# Patient Record
Sex: Female | Born: 1975 | Race: Black or African American | Hispanic: No | Marital: Single | Smoking: Current every day smoker
Health system: Southern US, Community
[De-identification: ages and names within clinical notes are randomized; demographics above are authoritative.]

## PROBLEM LIST (undated history)

## (undated) HISTORY — PX: TUBAL LIGATION: SHX77

---

## 2007-01-24 ENCOUNTER — Emergency Department: Payer: Self-pay

## 2011-05-13 ENCOUNTER — Emergency Department: Payer: Self-pay | Admitting: *Deleted

## 2018-10-11 ENCOUNTER — Ambulatory Visit: Payer: Self-pay | Attending: Oncology

## 2018-10-11 ENCOUNTER — Encounter (INDEPENDENT_AMBULATORY_CARE_PROVIDER_SITE_OTHER): Payer: Self-pay

## 2018-10-11 ENCOUNTER — Ambulatory Visit
Admission: RE | Admit: 2018-10-11 | Discharge: 2018-10-11 | Disposition: A | Discharge status: Home / Self Care | Payer: Self-pay | Source: Ambulatory Visit | Attending: Oncology | Admitting: Oncology

## 2018-10-11 VITALS — BP 159/92 | HR 76 | Temp 98.3°F | Ht 68.25 in | Wt 207.3 lb

## 2018-10-11 DIAGNOSIS — Z0000 Encounter for general adult medical examination without abnormal findings: Secondary | ICD-10-CM | POA: Insufficient documentation

## 2018-10-11 NOTE — Progress Notes (Addendum)
  Subjective:     Patient ID: Rebecca Larsen, female   DOB: [birth year], 43 y.o.   MRN: [id number]  HPI   Review of Systems     Objective:   Physical Exam Chest:     Breasts:        Right: No swelling, bleeding, inverted nipple, mass, nipple discharge, skin change or tenderness.        Left: No swelling, bleeding, inverted nipple, mass, nipple discharge, skin change or tenderness.       Comments: Symmetrical upper outer quadrant fibroglanular tissue and thickening Genitourinary:    Labia:        Right: No rash, tenderness, lesion or injury.        Left: No rash, tenderness, lesion or injury.      Urethra: No prolapse, urethral pain, urethral swelling or urethral lesion.     Cervix: No cervical motion tenderness, discharge, friability, lesion, erythema, cervical bleeding or eversion.     Uterus: Not deviated, not enlarged, not fixed, not tender and no uterine prolapse.      Adnexa:        Right: No mass, tenderness or fullness.         Left: No mass, tenderness or fullness.          Assessment:     43 year old patient presents for BCCCP clinic visit.  Reported to [facility] her mother had history of breast cancer, but when asked again she said no breast cancer just Lupus.  Patient screened, and meets BCCCP eligibility.  Patient does not have insurance, Medicare or Medicaid.  Handout given on Affordable Care Act.  Instructed patient on breast self awareness using teach back method.  Clinical breast exam unremarkable.  Palpated bilateral, symmetrical upper outer quadrant, fibroglandular tissue, and thickening.  No dominant mass palpated.  Pelvic exam normal .     Risk Assessment    Risk Scores      10/11/2018   Last edited by: Scarlett Presto, RN   5-year risk: 0.7 %   Lifetime risk: 9.5 %          Plan:     Sent for bilateral baseline screening mammogram.  Specimen collected for pap.

## 2018-10-12 NOTE — Progress Notes (Signed)
Letter mailed from [facility] to notify of normal mammogram results.  Pap results pending.

## 2018-10-15 LAB — PAP LB AND HPV HIGH-RISK: HPV, HIGH-RISK: NEGATIVE

## 2018-10-16 NOTE — Progress Notes (Signed)
Pap results negative/negative HPV.  Next pap in 5 years.  Patient to return in one year for BCCCP screening. Copy to [facility].

## 2019-05-30 ENCOUNTER — Other Ambulatory Visit: Payer: Self-pay

## 2019-05-30 ENCOUNTER — Emergency Department
Admission: EM | Admit: 2019-05-30 | Discharge: 2019-05-30 | Disposition: A | Discharge status: Home / Self Care | Payer: Self-pay | Attending: Emergency Medicine | Admitting: Emergency Medicine

## 2019-05-30 DIAGNOSIS — F17200 Nicotine dependence, unspecified, uncomplicated: Secondary | ICD-10-CM | POA: Insufficient documentation

## 2019-05-30 DIAGNOSIS — N611 Abscess of the breast and nipple: Secondary | ICD-10-CM | POA: Insufficient documentation

## 2019-05-30 MED ORDER — SULFAMETHOXAZOLE-TRIMETHOPRIM 800-160 MG PO TABS
1.0000 | ORAL_TABLET | Freq: Once | ORAL | Status: AC
  Administered 2019-05-30: 1 via ORAL
  Filled 2019-05-30: qty 1

## 2019-05-30 MED ORDER — LIDOCAINE HCL (PF) 1 % IJ SOLN
5.0000 mL | Freq: Once | INTRAMUSCULAR | Status: AC
  Administered 2019-05-30: 5 mL via INTRADERMAL
  Filled 2019-05-30: qty 5

## 2019-05-30 MED ORDER — SULFAMETHOXAZOLE-TRIMETHOPRIM 800-160 MG PO TABS: 1.0000 | ORAL_TABLET | Freq: Two times a day (BID) | ORAL | 0 refills | Status: DC

## 2019-05-30 NOTE — ED Notes (Signed)
Raised mass observed on medial portion of pt's rt breast, weeping with clear drainage.

## 2019-05-30 NOTE — ED Triage Notes (Signed)
Reports possible abscess to right breast X 1 month. Pt alert and oriented X4, cooperative, RR even and unlabored, color WNL. Pt in NAD. Denies drainage.

## 2019-05-30 NOTE — ED Provider Notes (Signed)
[facility] Emergency Department Provider Note  ____________________________________________  Time seen: Approximately 8:39 AM  I have reviewed the triage vital signs and the nursing notes.   HISTORY  Chief Complaint Abscess    HPI Rebecca Larsen is a 43 y.o. female that presents emergency department for evaluation of right breast abscess for about 1 month that has been more painful for the last couple of days.  Patient states that there has been some minimal drainage from the center.  She has never had an abscess prior.  No fevers.   History reviewed. No pertinent past medical history.  There are no active problems to display for this patient.   History reviewed. No pertinent surgical history.  Prior to Admission medications   Medication Sig Start Date End Date Taking? Authorizing Provider  sulfamethoxazole-trimethoprim (BACTRIM DS) 800-160 MG tablet Take 1 tablet by mouth 2 (two) times daily. 05/30/19   Laban Emperor, PA-C    Allergies Patient has no known allergies.  Family History  Problem Relation Age of Onset  . Breast cancer Neg Hx     Social History Social History   Tobacco Use  . Smoking status: Current Every Day Smoker  . Smokeless tobacco: Never Used  Substance Use Topics  . Alcohol use: Yes    Comment: occasionally  . Drug use: Never     Review of Systems  Constitutional: No fever/chills Gastrointestinal: No nausea, no vomiting.  Musculoskeletal: Negative for musculoskeletal pain. Skin: Negative for abrasions, lacerations, ecchymosis.  Positive for rash.  ____________________________________________   PHYSICAL EXAM:  VITAL SIGNS: ED Triage Vitals  Enc Vitals Group     BP 05/30/19 0811 (!) 158/75     Pulse --      Resp 05/30/19 0811 18     Temp 05/30/19 0811 98.8 F (37.1 C)     Temp Source 05/30/19 0811 Oral     SpO2 05/30/19 0811 100 %     Weight 05/30/19 0812 200 lb (90.7 kg)     Height 05/30/19 0812 5\' 8"   (1.727 m)     Head Circumference --      Peak Flow --      Pain Score 05/30/19 0812 10     Pain Loc --      Pain Edu? --      Excl. in [place]? --      Constitutional: Alert and oriented. Well appearing and in no acute distress. Eyes: Conjunctivae are normal. PERRL. EOMI. Head: Atraumatic. ENT:      Ears:      Nose: No congestion/rhinnorhea.      Mouth/Throat: Mucous membranes are moist.  Neck: No stridor.   Cardiovascular: Normal rate, regular rhythm.  Good peripheral circulation. Respiratory: Normal respiratory effort without tachypnea or retractions. Lungs CTAB. Good air entry to the bases with no decreased or absent breath sounds. Musculoskeletal: Full range of motion to all extremities. No gross deformities appreciated. Neurologic:  Normal speech and language. No gross focal neurologic deficits are appreciated.  Skin:  Skin is warm, dry.  1 cm x 1 cm area of superficial fluctuance with overlying skin changes and minimal yellow discharge to right breast proximal to the midline. Psychiatric: Mood and affect are normal. Speech and behavior are normal. Patient exhibits appropriate insight and judgement.   ____________________________________________   LABS (all labs ordered are listed, but only abnormal results are displayed)  Labs Reviewed - No data to display ____________________________________________  EKG   ____________________________________________  RADIOLOGY  No results found.  ____________________________________________    PROCEDURES  Procedure(s) performed:    Procedures  INCISION AND DRAINAGE Performed by: Enid DerryAshley Lleyton Byers Consent: Verbal consent obtained. Risks and benefits: risks, benefits and alternatives were discussed Type: abscess  Body area: breast  Anesthesia: local infiltration  Incision was made with a scalpel.  Local anesthetic: lidocaine 1 % without epinephrine  Anesthetic total: 2 ml  Complexity: complex Blunt dissection to  break up loculations  Drainage: purulent  Drainage amount: moderate  Packing material: 1/4 in iodoform gauze  Patient tolerance: Patient tolerated the procedure well with no immediate complications.    Medications  lidocaine (PF) (XYLOCAINE) 1 % injection 5 mL (has no administration in time range)  sulfamethoxazole-trimethoprim (BACTRIM DS) 800-160 MG per tablet 1 tablet (has no administration in time range)     ____________________________________________   INITIAL IMPRESSION / ASSESSMENT AND PLAN / ED COURSE  Pertinent labs & imaging results that were available during my care of the patient were reviewed by me and considered in my medical decision making (see chart for details).  Review of the [place] CSRS was performed in accordance of the [facility] prior to dispensing any controlled drugs.     Patient's diagnosis is consistent with breast abscess.  Vital signs and exam are reassuring.  Abscess was very superficial so was drained in the emergency department.  Abscess drained a moderate amount of drainage.  Patient was given a a dose of Bactrim in the emergency department.  Patient will be discharged home with prescriptions for Bactrim. Patient is to follow up with breast center primary care as directed. Patient is given ED precautions to return to the ED for any worsening or new symptoms.   Rebecca Larsen was evaluated in Emergency Department on 05/30/2019 for the symptoms described in the history of present illness. She was evaluated in the context of the global COVID-19 pandemic, which necessitated consideration that the patient might be at risk for infection with the SARS-CoV-2 virus that causes COVID-19. Institutional protocols and algorithms that pertain to the evaluation of patients at risk for COVID-19 are in a state of rapid change based on information released by regulatory bodies including the CDC and federal and state organizations. These policies and algorithms were followed  during the patient's care in the ED.  ____________________________________________  FINAL CLINICAL IMPRESSION(S) / ED DIAGNOSES  Final diagnoses:  Breast abscess      NEW MEDICATIONS STARTED DURING THIS VISIT:  ED Discharge Orders         Ordered    sulfamethoxazole-trimethoprim (BACTRIM DS) 800-160 MG tablet  2 times daily     05/30/19 0858              This chart was dictated using voice recognition software/Dragon. Despite best efforts to proofread, errors can occur which can change the meaning. Any change was purely unintentional.    Enid DerryWagner, Durwood Dittus, PA-C 05/30/19 1051    Emily FilbertWilliams, Jonathan E, MD 05/30/19 [phone]

## 2019-09-28 IMAGING — MG DIGITAL SCREENING BILATERAL MAMMOGRAM WITH TOMO AND CAD
6 of 10 series · 6 of 30 positions shown · non-contrast
Comparison: None.

CLINICAL DATA: Screening.

EXAM:
DIGITAL SCREENING BILATERAL MAMMOGRAM WITH TOMO AND CAD

[R MLO synth-2D]
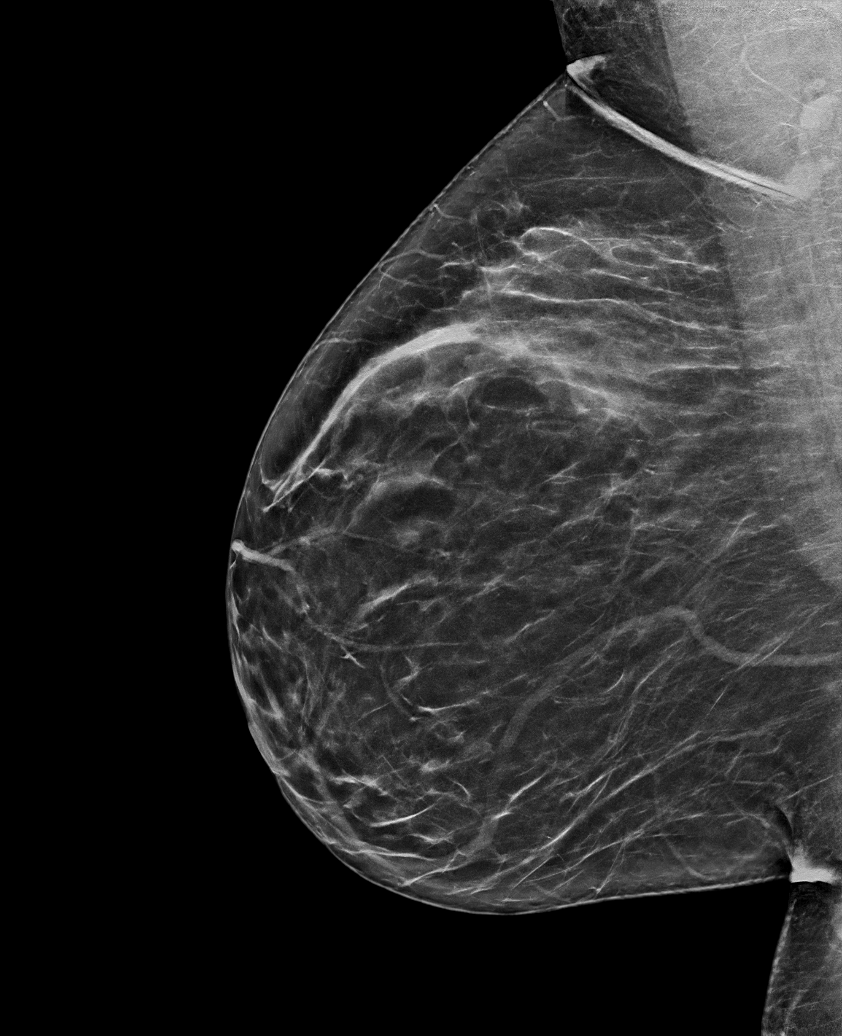

[R CC synth-2D]
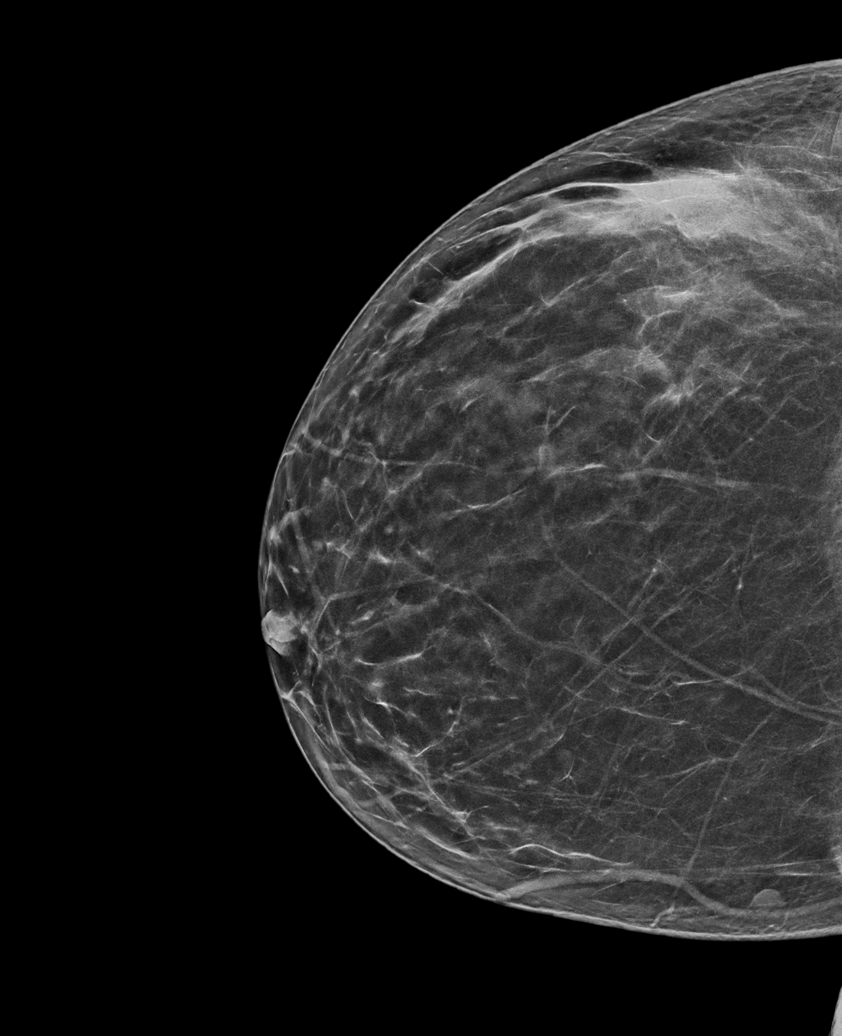

[L CC synth-2D]
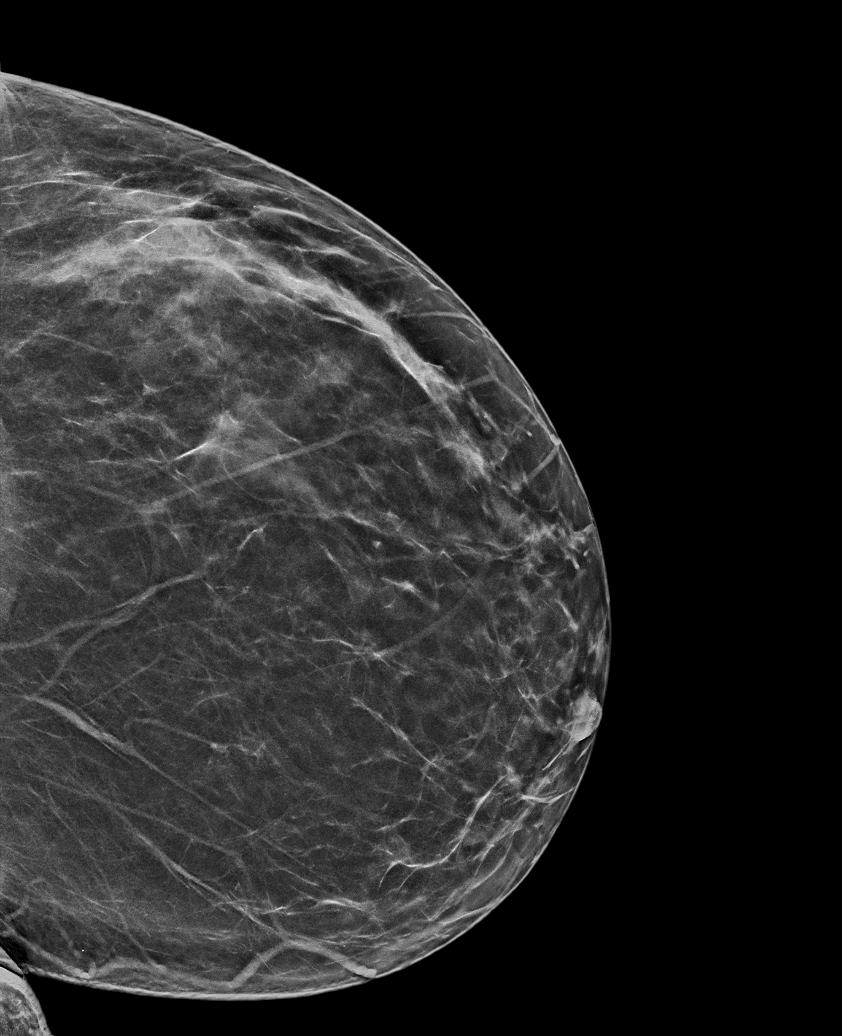

[L CV synth-2D]
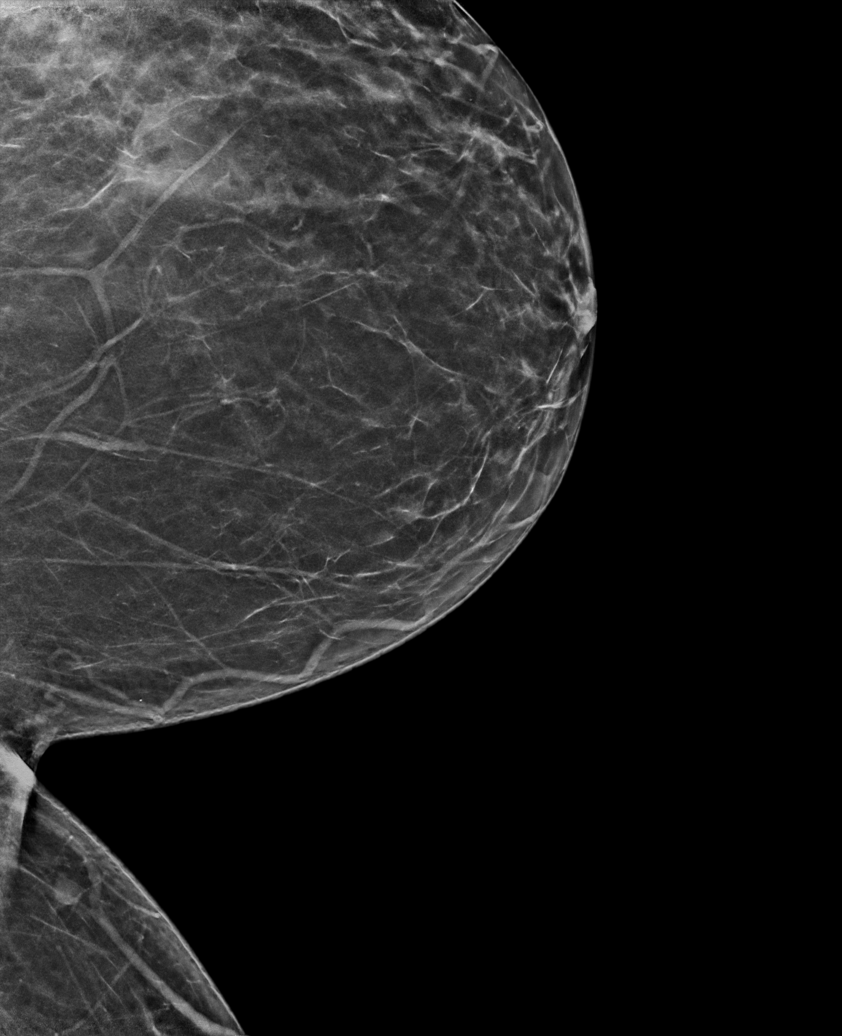

[L MLO synth-2D]
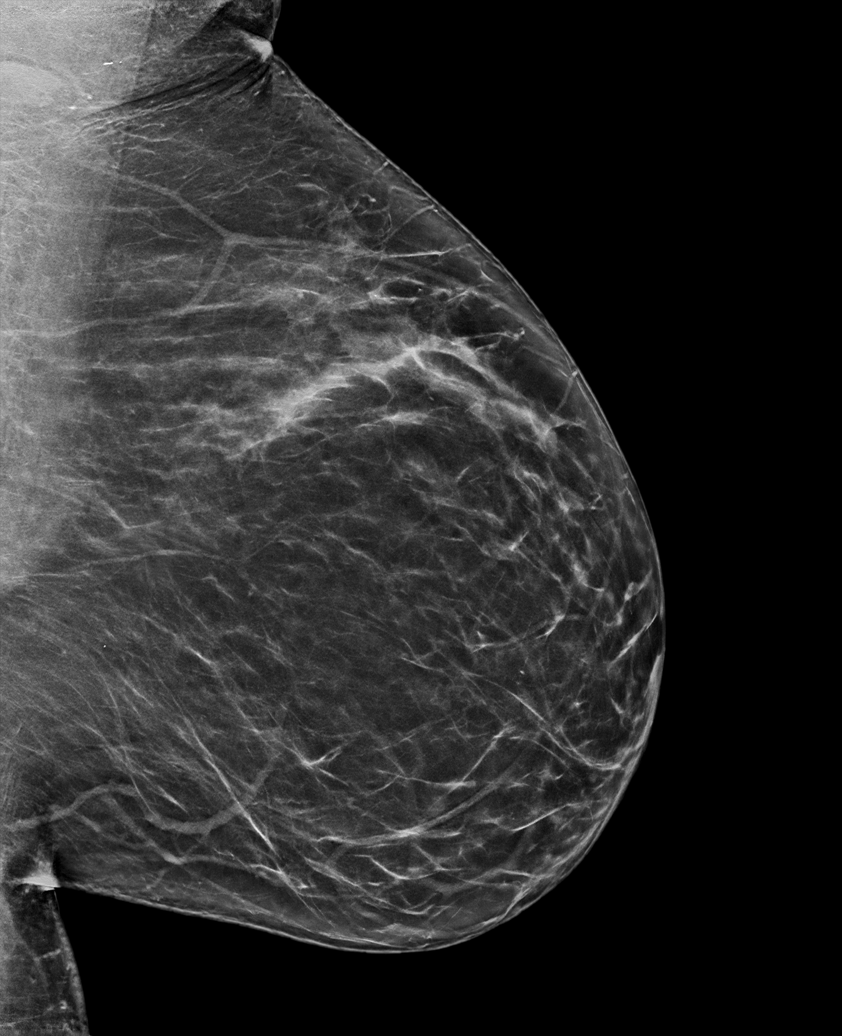

[L CV tomo · tomo slice 32/63.0]
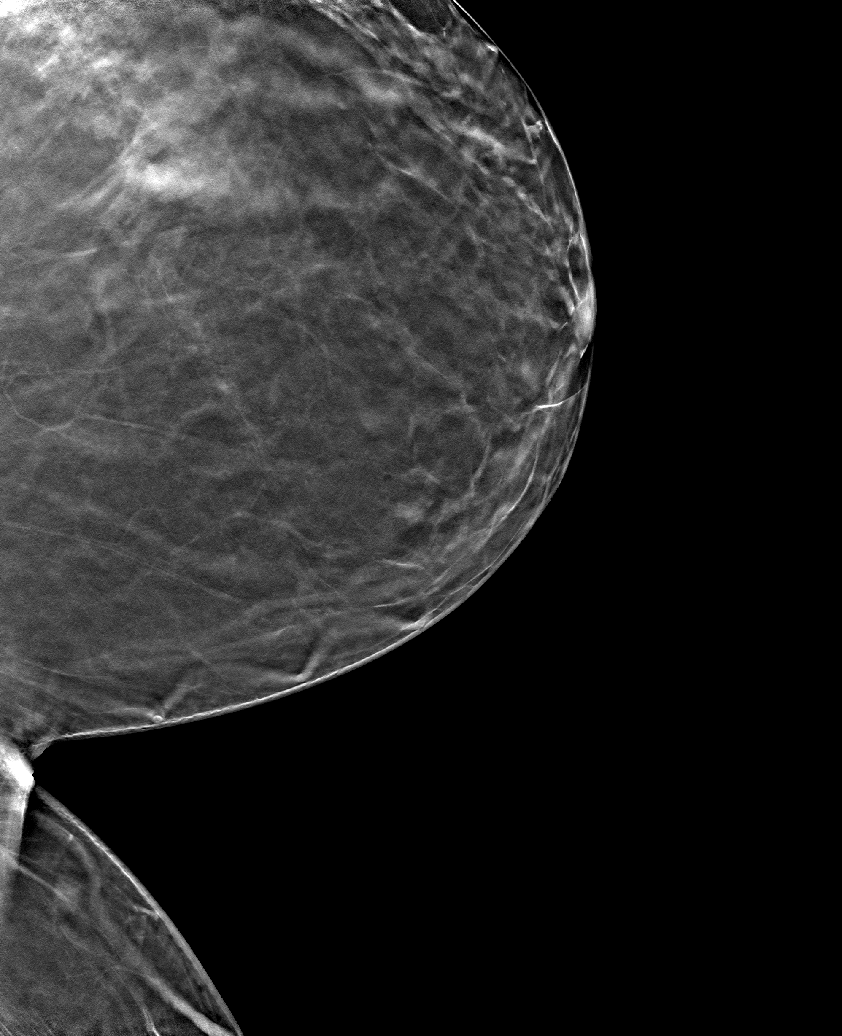

[6 of 30 positions shown; findings below may reference images not displayed]

ACR Breast Density Category b: There are scattered areas of
fibroglandular density.
FINDINGS: There are no findings suspicious for malignancy. Images were
processed with CAD.
IMPRESSION: No mammographic evidence of malignancy. A result letter of this
screening mammogram will be mailed directly to the patient.

RECOMMENDATION:
Screening mammogram in one year. (Code:Y5-G-EJ6)

BI-RADS CATEGORY  1: Negative.

## 2022-11-01 ENCOUNTER — Telehealth: Payer: Self-pay

## 2022-11-01 NOTE — Telephone Encounter (Signed)
Please call patient back, she wants to schedule an appointment.

## 2022-12-27 DIAGNOSIS — N921 Excessive and frequent menstruation with irregular cycle: Secondary | ICD-10-CM

## 2022-12-27 HISTORY — DX: Excessive and frequent menstruation with irregular cycle: N92.1

## 2023-01-14 ENCOUNTER — Other Ambulatory Visit: Payer: Self-pay | Admitting: Obstetrics and Gynecology

## 2023-01-24 NOTE — H&P (Signed)
Rebecca Larsen is a 47 y.o. female here for Rf Rebecca Larsen .she has elected for a novasure ablation with hysteroscopy    embx : neg Pap no dysplasia  Pt here for eval for MMR  with bleeding 2x/ week and lasting for 5-6 days . Started 07/2022 No pain , using BTL for contraception    G3P3 s/p SVD x3 U/s :    Uterus anteverted   Multiple  nabothian cysts:  largest=2cm   Endometrium=9.48mm   Rt ovary contains 2 cysts: 1)complex  septated=1.5cm; septation=0.19cm    2)simple=3.7cm Rt ovary volume=24.95ml Lt ovary contains 2 cysts:1)simple=2cm     2)complex septated=1.7cm; septation=0.13cm Lt ovary volume=11.60ml No free fluid seen Past Medical History:  has no past medical history on file.  Past Surgical History:  has a past surgical history that includes bilateral tubal ligation. Family History: family history includes Cancer in her mother; Colon cancer in her maternal grandfather. Social History:  reports that she has been smoking cigarettes. She has never used smokeless tobacco. She reports current alcohol use. She reports current drug use. Drug: Marijuana. OB/GYN History:  OB History       Gravida  3   Para  3   Term  3   Preterm      AB      Living  3        SAB      IAB      Ectopic      Molar      Multiple      Live Births  2             Allergies: has No Known Allergies. Medications: Current Medications  No current outpatient medications on file.     Review of Systems: General:                      No fatigue or weight loss Eyes:                           No vision changes Ears:                            No hearing difficulty Respiratory:                No cough or shortness of breath Pulmonary:                  No asthma or shortness of breath Cardiovascular:           No chest pain, palpitations, dyspnea on exertion Gastrointestinal:          No abdominal bloating, chronic diarrhea, constipations, masses, pain or  hematochezia Genitourinary:             No hematuria, dysuria, abnormal vaginal discharge, pelvic pain, +Menometrorrhagia Lymphatic:                   No swollen lymph nodes Musculoskeletal:No muscle weakness Neurologic:                  No extremity weakness, syncope, seizure disorder Psychiatric:                  No history of depression, delusions or suicidal/homicidal ideation      Exam:       Vitals:    01/10/23 1015  BP: (!) 145/80  Pulse: 78  Body mass index is 26 kg/m.   WDWN / black female in NAD   Lungs: CTA  CV : RRR without murmur   Breast: exam done in sitting and lying position : No dimpling or retraction, no dominant mass, no spontaneous discharge, no axillary adenopathy Neck:  no thyromegaly Abdomen: soft , no mass, normal active bowel sounds,  non-tender, no rebound tenderness Pelvic: tanner stage 5 ,  External genitalia: vulva /labia no lesions Urethra: no prolapse Vagina: grey d/c  wet mount : + trichomonas( now treated )   Cervix: no lesions, no cervical motion tenderness   Uterus: normal size shape and contour, non-tender Adnexa: no mass,  non-tender   After counseling she agrees to a EMBX :   Impression:    The primary encounter diagnosis was  Diagnoses of Menometrorrhagia, .       Plan:  Pt has elected for H/S with Novasure ablation of endometrium . She is aware of the possibility that the procedure may not be completed  due to her cavity dimensions   risks discussed , see [facility] notes                                                                         Rebecca Prader, MD            Electronically signed by Briya Lookabaugh, Sabas Sous, MD

## 2023-01-26 ENCOUNTER — Encounter
Admission: RE | Admit: 2023-01-26 | Discharge: 2023-01-26 | Disposition: A | Discharge status: Home / Self Care | Payer: BC Managed Care – PPO | Source: Ambulatory Visit | Attending: Obstetrics and Gynecology | Admitting: Obstetrics and Gynecology

## 2023-01-26 VITALS — Ht 69.0 in | Wt 171.0 lb

## 2023-01-26 DIAGNOSIS — Z01812 Encounter for preprocedural laboratory examination: Secondary | ICD-10-CM

## 2023-01-26 NOTE — Patient Instructions (Addendum)
Your procedure is scheduled on: Thursday, May 9 Report to the Registration Desk on the 1st floor of the [facility] To find out your arrival time, please call [phone] between 1PM - 3PM on: Wednesday, May 8 If your arrival time is 6:00 am, do not arrive before that time as the [facility] entrance doors do not open until 6:00 am.  REMEMBER: Instructions that are not followed completely may result in serious medical risk, up to and including death; or upon the discretion of your surgeon and anesthesiologist your surgery may need to be rescheduled.  Do not eat food after midnight the night before surgery.  No gum chewing or hard candies.  You may however, drink CLEAR liquids up to 2 hours before you are scheduled to arrive for your surgery. Do not drink anything within 2 hours of your scheduled arrival time.  Clear liquids include: - water  - apple juice without pulp - gatorade (not RED colors) - black coffee or tea (Do NOT add milk or creamers to the coffee or tea) Do NOT drink anything that is not on this list.  In addition, your doctor has ordered for you to drink the provided:  Ensure Pre-Surgery Clear Carbohydrate Drink  Drinking this carbohydrate drink up to two hours before surgery helps to reduce insulin resistance and improve patient outcomes. Please complete drinking 2 hours before scheduled arrival time.  One week prior to surgery: starting May 2 Stop Anti-inflammatories (NSAIDS) such as Advil, Aleve, Ibuprofen, Motrin, Naproxen, Naprosyn and Aspirin based products such as Excedrin, Goody's Powder, BC Powder. Stop ANY OVER THE COUNTER supplements until after surgery. You may however, continue to take Tylenol if needed for pain up until the day of surgery.  DO NOT TAKE ANY MEDICATIONS THE MORNING OF SURGERY   No Alcohol for 24 hours before or after surgery.  No Smoking including e-cigarettes for 24 hours before surgery.  No chewable tobacco products for at least 6  hours before surgery.  No nicotine patches on the day of surgery.  Do not use any "recreational" drugs for at least a week (preferably 2 weeks) before your surgery.  Please be advised that the combination of cocaine and anesthesia may have negative outcomes, up to and including death. If you test positive for cocaine, your surgery will be cancelled.  On the morning of surgery brush your teeth with toothpaste and water, you may rinse your mouth with mouthwash if you wish. Do not swallow any toothpaste or mouthwash.  Do not wear jewelry, make-up, hairpins, clips or nail polish.  Do not wear lotions, powders, or perfumes.   Do not shave body hair from the neck down 48 hours before surgery.  Contact lenses, hearing aids and dentures may not be worn into surgery.  Do not bring valuables to the hospital. [facility] is not responsible for any missing/lost belongings or valuables.   Notify your doctor if there is any change in your medical condition (cold, fever, infection).  Wear comfortable clothing (specific to your surgery type) to the hospital.  After surgery, you can help prevent lung complications by doing breathing exercises.  Take deep breaths and cough every 1-2 hours. Your doctor may order a device called an Incentive Spirometer to help you take deep breaths.  If you are being discharged the day of surgery, you will not be allowed to drive home. You will need a responsible individual to drive you home and stay with you for 24 hours after surgery.  If you are taking public transportation, you will need to have a responsible individual with you.  Please call the Pre-admissions Testing Dept. at [phone] if you have any questions about these instructions.  Surgery Visitation Policy:  Patients having surgery or a procedure may have two visitors.  Children under the age of 65 must have an adult with them who is not the patient.

## 2023-01-28 ENCOUNTER — Encounter
Admission: RE | Admit: 2023-01-28 | Discharge: 2023-01-28 | Disposition: A | Discharge status: Home / Self Care | Payer: BC Managed Care – PPO | Source: Ambulatory Visit | Attending: Obstetrics and Gynecology | Admitting: Obstetrics and Gynecology

## 2023-01-28 DIAGNOSIS — Z01818 Encounter for other preprocedural examination: Secondary | ICD-10-CM

## 2023-01-28 DIAGNOSIS — Z01812 Encounter for preprocedural laboratory examination: Secondary | ICD-10-CM | POA: Insufficient documentation

## 2023-01-28 LAB — TYPE AND SCREEN
ABO/RH(D): O POS
Antibody Screen: NEGATIVE

## 2023-01-28 LAB — BASIC METABOLIC PANEL
Anion gap: 7 (ref 5–15)
BUN: 16 mg/dL (ref 6–20)
CO2: 24 mmol/L (ref 22–32)
Calcium: 8.9 mg/dL (ref 8.9–10.3)
Chloride: 108 mmol/L (ref 98–111)
Creatinine, Ser: 0.73 mg/dL (ref 0.44–1.00)
GFR, Estimated: >60 mL/min (ref >60)
Glucose, Bld: 83 mg/dL (ref 70–99)
Potassium: 3.3 mmol/L — ABNORMAL LOW (ref 3.5–5.1)
Sodium: 139 mmol/L (ref 135–145)

## 2023-01-28 LAB — CBC
HCT: 38.6 % (ref 36.0–46.0)
Hemoglobin: 12.9 g/dL (ref 12.0–15.0)
MCH: 32.8 pg (ref 26.0–34.0)
MCHC: 33.4 g/dL (ref 30.0–36.0)
MCV: 98.2 fL (ref 80.0–100.0)
Platelets: 242 10*3/uL (ref 150–400)
RBC: 3.93 MIL/uL (ref 3.87–5.11)
RDW: 12.7 % (ref 11.5–15.5)
WBC: 5.6 10*3/uL (ref 4.0–10.5)
nRBC: 0 % (ref 0.0–0.2)

## 2023-02-03 ENCOUNTER — Ambulatory Visit: Payer: BC Managed Care – PPO | Admitting: Anesthesiology

## 2023-02-03 ENCOUNTER — Other Ambulatory Visit: Payer: Self-pay

## 2023-02-03 ENCOUNTER — Ambulatory Visit: Payer: BC Managed Care – PPO | Admitting: Urgent Care

## 2023-02-03 ENCOUNTER — Encounter: Payer: Self-pay | Admitting: Obstetrics and Gynecology

## 2023-02-03 ENCOUNTER — Ambulatory Visit
Admission: RE | Admit: 2023-02-03 | Discharge: 2023-02-03 | Disposition: A | Discharge status: Home / Self Care | Payer: BC Managed Care – PPO | Attending: Obstetrics and Gynecology | Admitting: Obstetrics and Gynecology

## 2023-02-03 ENCOUNTER — Encounter
Admission: RE | Disposition: A | Discharge status: Home / Self Care | Payer: Self-pay | Source: Home / Self Care | Attending: Obstetrics and Gynecology

## 2023-02-03 DIAGNOSIS — Z800 Family history of malignant neoplasm of digestive organs: Secondary | ICD-10-CM | POA: Insufficient documentation

## 2023-02-03 DIAGNOSIS — F17210 Nicotine dependence, cigarettes, uncomplicated: Secondary | ICD-10-CM | POA: Diagnosis not present

## 2023-02-03 DIAGNOSIS — N921 Excessive and frequent menstruation with irregular cycle: Secondary | ICD-10-CM | POA: Diagnosis present

## 2023-02-03 DIAGNOSIS — Z01818 Encounter for other preprocedural examination: Secondary | ICD-10-CM

## 2023-02-03 DIAGNOSIS — Z01812 Encounter for preprocedural laboratory examination: Secondary | ICD-10-CM

## 2023-02-03 DIAGNOSIS — Z9851 Tubal ligation status: Secondary | ICD-10-CM | POA: Diagnosis not present

## 2023-02-03 HISTORY — PX: DILITATION & CURRETTAGE/HYSTROSCOPY WITH NOVASURE ABLATION: SHX5568

## 2023-02-03 LAB — POCT PREGNANCY, URINE: Preg Test, Ur: NEGATIVE

## 2023-02-03 LAB — ABO/RH: ABO/RH(D): O POS

## 2023-02-03 SURGERY — DILATATION & CURETTAGE/HYSTEROSCOPY WITH NOVASURE ABLATION
Anesthesia: General

## 2023-02-03 MED ORDER — GABAPENTIN 300 MG PO CAPS
300.0000 mg | ORAL_CAPSULE | ORAL | Status: AC
  Administered 2023-02-03: 300 mg via ORAL

## 2023-02-03 MED ORDER — OXYCODONE-ACETAMINOPHEN 5-325 MG PO TABS: 1.0000 | ORAL_TABLET | ORAL | Status: DC | PRN

## 2023-02-03 MED ORDER — ACETAMINOPHEN 500 MG PO TABS
1000.0000 mg | ORAL_TABLET | ORAL | Status: AC
  Administered 2023-02-03: 1000 mg via ORAL

## 2023-02-03 MED ORDER — OXYCODONE HCL 5 MG/5ML PO SOLN: 5.0000 mg | Freq: Once | ORAL | Status: AC | PRN

## 2023-02-03 MED ORDER — LACTATED RINGERS IV SOLN: INTRAVENOUS | Status: DC

## 2023-02-03 MED ORDER — FENTANYL CITRATE (PF) 100 MCG/2ML IJ SOLN
INTRAMUSCULAR | Status: AC
  Filled 2023-02-03: qty 2

## 2023-02-03 MED ORDER — LABETALOL HCL 5 MG/ML IV SOLN
INTRAVENOUS | Status: DC | PRN
  Administered 2023-02-03: 10 mg via INTRAVENOUS

## 2023-02-03 MED ORDER — 0.9 % SODIUM CHLORIDE (POUR BTL) OPTIME
TOPICAL | Status: DC | PRN
  Administered 2023-02-03: 500 mL

## 2023-02-03 MED ORDER — FENTANYL CITRATE (PF) 100 MCG/2ML IJ SOLN
INTRAMUSCULAR | Status: DC | PRN
  Administered 2023-02-03: 100 ug via INTRAVENOUS

## 2023-02-03 MED ORDER — FAMOTIDINE 20 MG PO TABS
ORAL_TABLET | ORAL | Status: AC
  Filled 2023-02-03: qty 1

## 2023-02-03 MED ORDER — LIDOCAINE HCL (CARDIAC) PF 100 MG/5ML IV SOSY
PREFILLED_SYRINGE | INTRAVENOUS | Status: DC | PRN
  Administered 2023-02-03: 100 mg via INTRAVENOUS

## 2023-02-03 MED ORDER — POVIDONE-IODINE 10 % EX SWAB: 2.0000 | Freq: Once | CUTANEOUS | Status: DC

## 2023-02-03 MED ORDER — CHLORHEXIDINE GLUCONATE 0.12 % MT SOLN
OROMUCOSAL | Status: AC
  Filled 2023-02-03: qty 15

## 2023-02-03 MED ORDER — OXYCODONE HCL 5 MG PO TABS
ORAL_TABLET | ORAL | Status: AC
  Filled 2023-02-03: qty 1

## 2023-02-03 MED ORDER — CHLORHEXIDINE GLUCONATE 0.12 % MT SOLN
15.0000 mL | Freq: Once | OROMUCOSAL | Status: AC
  Administered 2023-02-03: 15 mL via OROMUCOSAL

## 2023-02-03 MED ORDER — GABAPENTIN 300 MG PO CAPS
ORAL_CAPSULE | ORAL | Status: AC
  Filled 2023-02-03: qty 1

## 2023-02-03 MED ORDER — SODIUM CHLORIDE 0.9 % IR SOLN
Status: DC | PRN
  Administered 2023-02-03: 1000 mL

## 2023-02-03 MED ORDER — PROPOFOL 10 MG/ML IV BOLUS
INTRAVENOUS | Status: DC | PRN
  Administered 2023-02-03: 50 ug/kg/min via INTRAVENOUS
  Administered 2023-02-03: 100 mg via INTRAVENOUS

## 2023-02-03 MED ORDER — ONDANSETRON HCL 4 MG/2ML IJ SOLN
INTRAMUSCULAR | Status: DC | PRN
  Administered 2023-02-03: 4 mg via INTRAVENOUS

## 2023-02-03 MED ORDER — FENTANYL CITRATE (PF) 100 MCG/2ML IJ SOLN
25.0000 ug | INTRAMUSCULAR | Status: DC | PRN
  Administered 2023-02-03 (×2): 25 ug via INTRAVENOUS

## 2023-02-03 MED ORDER — ALBUTEROL SULFATE HFA 108 (90 BASE) MCG/ACT IN AERS
INHALATION_SPRAY | RESPIRATORY_TRACT | Status: DC | PRN
  Administered 2023-02-03: 6 via RESPIRATORY_TRACT

## 2023-02-03 MED ORDER — KETOROLAC TROMETHAMINE 30 MG/ML IJ SOLN
INTRAMUSCULAR | Status: DC | PRN
  Administered 2023-02-03: 30 mg via INTRAVENOUS

## 2023-02-03 MED ORDER — CEFAZOLIN SODIUM-DEXTROSE 2-4 GM/100ML-% IV SOLN
2.0000 g | Freq: Once | INTRAVENOUS | Status: AC
  Administered 2023-02-03: 2 g via INTRAVENOUS

## 2023-02-03 MED ORDER — ORAL CARE MOUTH RINSE: 15.0000 mL | Freq: Once | OROMUCOSAL | Status: AC

## 2023-02-03 MED ORDER — DEXAMETHASONE SODIUM PHOSPHATE 10 MG/ML IJ SOLN
INTRAMUSCULAR | Status: DC | PRN
  Administered 2023-02-03: 10 mg via INTRAVENOUS

## 2023-02-03 MED ORDER — FAMOTIDINE 20 MG PO TABS
20.0000 mg | ORAL_TABLET | Freq: Once | ORAL | Status: AC
  Administered 2023-02-03: 20 mg via ORAL

## 2023-02-03 MED ORDER — DEXMEDETOMIDINE HCL IN NACL 200 MCG/50ML IV SOLN
INTRAVENOUS | Status: DC | PRN
  Administered 2023-02-03: 12 ug via INTRAVENOUS

## 2023-02-03 MED ORDER — OXYCODONE HCL 5 MG PO TABS
5.0000 mg | ORAL_TABLET | Freq: Once | ORAL | Status: AC | PRN
  Administered 2023-02-03: 5 mg via ORAL

## 2023-02-03 MED ORDER — ACETAMINOPHEN 500 MG PO TABS
ORAL_TABLET | ORAL | Status: AC
  Filled 2023-02-03: qty 2

## 2023-02-03 SURGICAL SUPPLY — 28 items
ABLATOR SURESOUND NOVASURE (ABLATOR) IMPLANT
BAG PRESSURE INF REUSE 1000 (BAG) ×1 IMPLANT
DEVICE MYOSURE LITE (MISCELLANEOUS) IMPLANT
DEVICE MYOSURE REACH (MISCELLANEOUS) IMPLANT
DRSG TELFA 3X8 NADH STRL (GAUZE/BANDAGES/DRESSINGS) ×1 IMPLANT
GLOVE SURG SYN 8.0 (GLOVE) ×1 IMPLANT
GLOVE SURG SYN 8.0 PF PI (GLOVE) ×1 IMPLANT
GOWN STRL REUS W/ TWL LRG LVL3 (GOWN DISPOSABLE) ×1 IMPLANT
GOWN STRL REUS W/ TWL XL LVL3 (GOWN DISPOSABLE) ×1 IMPLANT
GOWN STRL REUS W/TWL LRG LVL3 (GOWN DISPOSABLE) ×1
GOWN STRL REUS W/TWL XL LVL3 (GOWN DISPOSABLE) ×1
IV NS 1000ML (IV SOLUTION) ×1
IV NS 1000ML BAXH (IV SOLUTION) ×1 IMPLANT
IV NS IRRIG 3000ML ARTHROMATIC (IV SOLUTION) ×1 IMPLANT
KIT PROCEDURE FLUENT (KITS) IMPLANT
KIT TURNOVER CYSTO (KITS) ×1 IMPLANT
MANIFOLD NEPTUNE II (INSTRUMENTS) ×1 IMPLANT
NS IRRIG 500ML POUR BTL (IV SOLUTION) IMPLANT
PACK DNC HYST (MISCELLANEOUS) IMPLANT
PAD OB MATERNITY 4.3X12.25 (PERSONAL CARE ITEMS) ×1 IMPLANT
PAD PREP OB/GYN DISP 24X41 (PERSONAL CARE ITEMS) ×1 IMPLANT
SCRUB CHG 4% DYNA-HEX 4OZ (MISCELLANEOUS) ×1 IMPLANT
SEAL ROD LENS SCOPE MYOSURE (ABLATOR) ×1 IMPLANT
SET CYSTO W/LG BORE CLAMP LF (SET/KITS/TRAYS/PACK) IMPLANT
TOWEL OR 17X26 4PK STRL BLUE (TOWEL DISPOSABLE) ×1 IMPLANT
TRAP FLUID SMOKE EVACUATOR (MISCELLANEOUS) ×1 IMPLANT
TUBING CONNECTING 10 (TUBING) IMPLANT
WATER STERILE IRR 500ML POUR (IV SOLUTION) ×1 IMPLANT

## 2023-02-03 NOTE — Op Note (Signed)
NAMEJOVANDA, CRILL MEDICAL RECORD NO: [id number] ACCOUNT NO: [id number] DATE OF BIRTH: 02/02/1976 FACILITY: [facility] LOCATION: [facility] PHYSICIAN: Suzy Bouchard, MD  Operative Report   DATE OF PROCEDURE: 02/03/2023  PREOPERATIVE DIAGNOSIS:  Menometrorrhagia.  POSTOPERATIVE DIAGNOSIS:  Menometrorrhagia.  PROCEDURE:  Hysteroscopy with NovaSure endometrial ablation.  SURGEON:  Suzy Bouchard, MD  ANESTHESIA:  General endotracheal anesthesia.  INDICATIONS:  47 year old patient with menometrorrhagia with bleeding every 2 weeks, lasting 4-5 days.  Endometrial biopsy and Pap smear were negative.  Ultrasound negative as well.  DESCRIPTION OF PROCEDURE:  After adequate general endotracheal anesthesia, the patient was placed in dorsal supine position with the legs in the candy cane stirrups.  The patient was prepped and draped in normal sterile fashion.  A timeout was performed.   The patient did receive 2 grams of IV Ancef prior to commencement of the case.  Straight catheterization of the bladder yielded 50 mL clear urine.  Weighted speculum was placed in the posterior vaginal vault and the anterior cervix was grasped with a  single tooth tenaculum.  Cervix was sounded to 8.5 cm and the cervix was dilated with #17 Hanks dilators followed by placement of the 5 mm hysteroscope into the endometrial cavity.  Fluffy endometrium noted.  No definitive polyps or masses.  Hysteroscope  was removed and the NovaSure ablator was brought up to the operative field and the ablator was placed into the endometrial cavity.  The cervical length was estimated at 4.5 cm.  Again, the cavity length 8.5 cm.  Therefore, a total net uterine cavity of  4 cm.  The array was opened and the uterine cavity width measured 3.5 cm.  Cavity assessment was then performed and passed and based on the uterine length of 4 cm and width of 3.5 cm power setting was set at 77 watts and the ablation then took place for   2 minutes.  The ablator was removed and the hysteroscope was readvanced showing normal charring effect as expected.  Single tooth tenaculum was removed.  Silver nitrate was applied to the tenacula sites.  Good hemostasis was noted.  There were no  complications.  ESTIMATED BLOOD LOSS:  Minimal.  URINE OUTPUT:  50 mL  INTRAOPERATIVE FLUIDS:  700 mL.  The patient did receive 30 mg intravenous Toradol at the end of the procedure.   PUS D: 02/03/2023 12:17:21 pm T: 02/03/2023 12:26:00 pm  JOB: [id number]/ [id number]

## 2023-02-03 NOTE — Brief Op Note (Signed)
02/03/2023  12:00 PM  PATIENT:  Rebecca Larsen  47 y.o. female  PRE-OPERATIVE DIAGNOSIS:  menometrorrhagia  POST-OPERATIVE DIAGNOSIS:  menometrorrhagia  PROCEDURE:  hysteroscopy and Novasure endometrial ablation  SURGEON:  Surgeon(s) and Role:    * Abhay Godbolt, Ihor Austin, MD - Primary  PHYSICIAN ASSISTANT:   ASSISTANTS: none   ANESTHESIA:   general  EBL:  minimal , IOF 700 uo 50 cc  BLOOD ADMINISTERED:none  DRAINS: none   LOCAL MEDICATIONS USED:  NONE  SPECIMEN:  No Specimen  DISPOSITION OF SPECIMEN:  N/A  COUNTS:  YES  TOURNIQUET:  * No tourniquets in log *  DICTATION: .Other Dictation: Dictation Number verbal  PLAN OF CARE: Discharge to home after PACU  PATIENT DISPOSITION:  PACU - hemodynamically stable.   Delay start of Pharmacological VTE agent (>24hrs) due to surgical blood loss or risk of bleeding: not applicable

## 2023-02-03 NOTE — Transfer of Care (Signed)
Immediate Anesthesia Transfer of Care Note  Patient: Rebecca Larsen  Procedure(s) Performed: DILATATION & CURETTAGE/HYSTEROSCOPY WITH NOVASURE ABLATION  Patient Location: PACU  Anesthesia Type:General  Level of Consciousness: drowsy  Airway & Oxygen Therapy: Patient connected to face mask oxygen  Post-op Assessment: Report given to RN and Post -op Vital signs reviewed and stable  Post vital signs: stable  Last Vitals:  Vitals Value Taken Time  BP 138/72 02/03/23 1211  Temp    Pulse 64 02/03/23 1212  Resp 24 02/03/23 1212  SpO2 100 % 02/03/23 1212  Vitals shown include unvalidated device data.  Last Pain:  Vitals:   02/03/23 0852  PainSc: 0-No pain         Complications: No notable events documented.

## 2023-02-03 NOTE — Anesthesia Postprocedure Evaluation (Signed)
Anesthesia Post Note  Patient: Sherrill Raring  Procedure(s) Performed: DILATATION & CURETTAGE/HYSTEROSCOPY WITH NOVASURE ABLATION  Patient location during evaluation: PACU Anesthesia Type: General Level of consciousness: awake and alert Pain management: pain level controlled Vital Signs Assessment: post-procedure vital signs reviewed and stable Respiratory status: spontaneous breathing, nonlabored ventilation, respiratory function stable and patient connected to nasal cannula oxygen Cardiovascular status: blood pressure returned to baseline and stable Postop Assessment: no apparent nausea or vomiting Anesthetic complications: no   There were no known notable events for this encounter.   Last Vitals:  Vitals:   02/03/23 1245 02/03/23 1300  BP: (!) 163/104 (!) 176/87  Pulse: (!) 58 (!) 56  Resp: 12 15  Temp:    SpO2: 100% 100%    Last Pain:  Vitals:   02/03/23 1300  PainSc: 5                  Cleda Mccreedy Constance Whittle

## 2023-02-03 NOTE — Anesthesia Procedure Notes (Signed)
Procedure Name: LMA Insertion Date/Time: 02/03/2023 11:32 AM  Performed by: Maryla Morrow., CRNAPre-anesthesia Checklist: Patient identified, Patient being monitored, Timeout performed, Emergency Drugs available and Suction available Patient Re-evaluated:Patient Re-evaluated prior to induction Oxygen Delivery Method: Circle system utilized Preoxygenation: Pre-oxygenation with 100% oxygen Induction Type: IV induction Ventilation: Mask ventilation without difficulty LMA: LMA inserted LMA Size: 4.0 Tube type: Oral Number of attempts: 1 Placement Confirmation: positive ETCO2 and breath sounds checked- equal and bilateral Tube secured with: Tape Dental Injury: Teeth and Oropharynx as per pre-operative assessment

## 2023-02-03 NOTE — Anesthesia Preprocedure Evaluation (Signed)
Anesthesia Evaluation  Patient identified by MRN, date of birth, ID band Patient awake    Reviewed: Allergy & Precautions, NPO status , Patient's Chart, lab work & pertinent test results  History of Anesthesia Complications Negative for: history of anesthetic complications  Airway Mallampati: III  TM Distance: >3 FB Neck ROM: full    Dental  (+) Chipped, Poor Dentition   Pulmonary neg pulmonary ROS, neg shortness of breath, Current Smoker   Pulmonary exam normal        Cardiovascular Exercise Tolerance: Good (-) angina (-) Past MI negative cardio ROS Normal cardiovascular exam     Neuro/Psych negative neurological ROS  negative psych ROS   GI/Hepatic negative GI ROS, Neg liver ROS,neg GERD  ,,  Endo/Other  negative endocrine ROS    Renal/GU      Musculoskeletal   Abdominal   Peds  Hematology negative hematology ROS (+)   Anesthesia Other Findings Past Medical History: 12/2022: Menometrorrhagia  Past Surgical History: No date: TUBAL LIGATION     Reproductive/Obstetrics negative OB ROS                             Anesthesia Physical Anesthesia Plan  ASA: 2  Anesthesia Plan: General LMA   Post-op Pain Management:    Induction: Intravenous  PONV Risk Score and Plan: Dexamethasone, Ondansetron, Midazolam and Treatment may vary due to age or medical condition  Airway Management Planned: LMA  Additional Equipment:   Intra-op Plan:   Post-operative Plan: Extubation in OR  Informed Consent: I have reviewed the patients History and Physical, chart, labs and discussed the procedure including the risks, benefits and alternatives for the proposed anesthesia with the patient or authorized representative who has indicated his/her understanding and acceptance.     Dental Advisory Given  Plan Discussed with: Anesthesiologist, CRNA and Surgeon  Anesthesia Plan Comments: (Patient  consented for risks of anesthesia including but not limited to:  - adverse reactions to medications - damage to eyes, teeth, lips or other oral mucosa - nerve damage due to positioning  - sore throat or hoarseness - Damage to heart, brain, nerves, lungs, other parts of body or loss of life  Patient voiced understanding.)       Anesthesia Quick Evaluation

## 2023-02-03 NOTE — Progress Notes (Signed)
Pt here for Novasure endometrial ablation and h/s for menorrhagia .  Labs reviewed  neg HCG . All questions answered . Proceed

## 2023-02-03 NOTE — Discharge Instructions (Signed)
AMBULATORY SURGERY  ?DISCHARGE INSTRUCTIONS ? ? ?The drugs that you were given will stay in your system until tomorrow so for the next 24 hours you should not: ? ?Drive an automobile ?Make any legal decisions ?Drink any alcoholic beverage ? ? ?You may resume regular meals tomorrow.  Today it is better to start with liquids and gradually work up to solid foods. ? ?You may eat anything you prefer, but it is better to start with liquids, then soup and crackers, and gradually work up to solid foods. ? ? ?Please notify your doctor immediately if you have any unusual bleeding, trouble breathing, redness and pain at the surgery site, drainage, fever, or pain not relieved by medication. ? ? ? ?Additional Instructions: ? ? ? ?Please contact your physician with any problems or Same Day Surgery at [phone], Monday through Friday 6 am to 4 pm, or [place] [facility] [place] Main number at [phone].  ?

## 2023-02-04 ENCOUNTER — Encounter: Payer: Self-pay | Admitting: Obstetrics and Gynecology

## 2024-02-07 ENCOUNTER — Other Ambulatory Visit: Payer: Self-pay

## 2024-02-07 ENCOUNTER — Emergency Department

## 2024-02-07 ENCOUNTER — Emergency Department
Admission: EM | Admit: 2024-02-07 | Discharge: 2024-02-07 | Disposition: A | Discharge status: Home / Self Care | Attending: Emergency Medicine | Admitting: Emergency Medicine

## 2024-02-07 DIAGNOSIS — R519 Headache, unspecified: Secondary | ICD-10-CM | POA: Diagnosis not present

## 2024-02-07 DIAGNOSIS — R059 Cough, unspecified: Secondary | ICD-10-CM | POA: Diagnosis present

## 2024-02-07 DIAGNOSIS — J069 Acute upper respiratory infection, unspecified: Secondary | ICD-10-CM | POA: Diagnosis not present

## 2024-02-07 DIAGNOSIS — M791 Myalgia, unspecified site: Secondary | ICD-10-CM | POA: Diagnosis not present

## 2024-02-07 LAB — RESP PANEL BY RT-PCR (RSV, FLU A&B, COVID)  RVPGX2
Influenza A by PCR: NEGATIVE
Influenza B by PCR: NEGATIVE
Resp Syncytial Virus by PCR: NEGATIVE
SARS Coronavirus 2 by RT PCR: NEGATIVE

## 2024-02-07 LAB — SARS CORONAVIRUS 2 BY RT PCR: SARS Coronavirus 2 by RT PCR: NEGATIVE

## 2024-02-07 MED ORDER — PSEUDOEPH-BROMPHEN-DM 30-2-10 MG/5ML PO SYRP: 2.5000 mL | ORAL_SOLUTION | Freq: Four times a day (QID) | ORAL | 0 refills | Status: AC | PRN

## 2024-02-07 NOTE — ED Triage Notes (Signed)
 Pt to ED for body aches, HA and nasal congestion since 2 weeks. States wants to make sure does not have covid. Denies cough. Pt in NAD.

## 2024-02-07 NOTE — Discharge Instructions (Addendum)
 You were seen in the emergency department today for a cough. Your respiratory panel which includes COVID, influenza and RSV were negative.  Your chest x-ray is normal.  A viral cough may last up to 2-3 weeks.   Take tylenol  or ibuprofen for pain or fever as directed.   Stay hydrated by drinking plenty of fluids to thin mucus. Get adequate amount of sleep and avoid overexertion. Consider a humidifier at night. Warm teas and a spoonful of honey may help reduce cough frequency. Follow up with your primary care provider as needed.   For sore throat Use throat lozenges or Chloraseptic spray.  Gargle with warm salt water several times daily

## 2024-02-07 NOTE — ED Provider Notes (Signed)
 [facility] Emergency Department Provider Note     Event Date/Time   First MD Initiated Contact with Patient 02/07/24 1930     (approximate)   History   Nasal Congestion and Generalized Body Aches   HPI  Rebecca Larsen is a 48 y.o. female with no significant past medical history presents to the ED for evaluation of dry cough, generalized bodyaches, headache and nasal congestion ongoing for 2 weeks.  Patient denies sick contacts, nausea/vomiting, diarrhea or abdominal pain.  She endorses subjective fevers at home.  Has tried multiple OTC medications with no relief.     Physical Exam   Triage Vital Signs: ED Triage Vitals  Encounter Vitals Group     BP 02/07/24 1741 (!) 162/64     Systolic BP Percentile --      Diastolic BP Percentile --      Pulse Rate 02/07/24 1741 85     Resp 02/07/24 1741 20     Temp 02/07/24 1741 98.5 F (36.9 C)     Temp Source 02/07/24 1741 Oral     SpO2 02/07/24 1741 97 %     Weight 02/07/24 1742 175 lb (79.4 kg)     Height 02/07/24 1742 5\' 7"  (1.702 m)     Head Circumference --      Peak Flow --      Pain Score 02/07/24 1740 5     Pain Loc --      Pain Education --      Exclude from Growth Chart --     Most recent vital signs: Vitals:   02/07/24 1741  BP: (!) 162/64  Pulse: 85  Resp: 20  Temp: 98.5 F (36.9 C)  SpO2: 97%    General: Alert and oriented. INAD. Presence of nasal congestion during speech.  Skin:  Warm, dry and intact. No rashes or lesions noted.     Head:  NCAT.  Throat: Oropharynx clear. No erythema or exudates. Tonsils not enlarged. Uvula is midline. CV:  Good peripheral perfusion. RRR.  RESP:  Normal effort. LCTAB. No retractions.    ED Results / Procedures / Treatments   Labs (all labs ordered are listed, but only abnormal results are displayed) Labs Reviewed  SARS CORONAVIRUS 2 BY RT PCR  RESP PANEL BY RT-PCR (RSV, FLU A&B, COVID)  [id number]   RADIOLOGY  I personally viewed  and evaluated these images as part of my medical decision making, as well as reviewing the written report by the radiologist.  ED Provider Interpretation: no focal consolidation  DG Chest 2 View Result Date: 02/07/2024 CLINICAL DATA:  Cough. EXAM: CHEST - 2 VIEW COMPARISON:  None Available. FINDINGS: No focal consolidation, pleural effusion or pneumothorax. The cardiac silhouette is within normal limits. No acute osseous pathology. IMPRESSION: No active cardiopulmonary disease. Electronically Signed   By: Angus Bark M.D.   On: 02/07/2024 20:02   PROCEDURES:  Critical Care performed: No  Procedures  MEDICATIONS ORDERED IN ED: Medications - No data to display   IMPRESSION / MDM / ASSESSMENT AND PLAN / ED COURSE  I reviewed the triage vital signs and the nursing notes.                              Clinical Course as of 02/07/24 2104  Tue Feb 07, 2024  2007 Declined pain medication [MH]  2008 DG Chest 2 View Negative PNA  [MH]  2009 SARS Coronavirus 2 by RT PCR (hospital order, performed in [facility] hospital lab) *cepheid single result test* Anterior Nasal Swab negative [MH]    Clinical Course User Index [MH] Alvaro Augusta A, PA-C    48 y.o. female presents to the emergency department for evaluation and treatment of nasal congestion x 2 weeks. See HPI for further details.   Differential diagnosis includes, but is not limited to PNA, viral URI, allergies   Patient's presentation is most consistent with acute complicated illness / injury requiring diagnostic workup.  Will obtain respiratory panel including RSV and Influenza and chest xray.    ----------------------------------------- 9:05 PM on 02/07/2024 -----------------------------------------  Reassuring chest x-ray, respiratory panel.  Symptoms are clinically consistent with a viral upper respiratory infection.  Encouraged to follow-up with primary care provider for further evaluation in 1 week.  Patient is in  stable condition for discharge home and outpatient management   FINAL CLINICAL IMPRESSION(S) / ED DIAGNOSES   Final diagnoses:  Viral upper respiratory tract infection     Rx / DC Orders   ED Discharge Orders          Ordered    brompheniramine-pseudoephedrine-DM 30-2-10 MG/5ML syrup  4 times daily PRN        02/07/24 2100             Note:  This document was prepared using Dragon voice recognition software and may include unintentional dictation errors.    Phyllis Breeze, Sheyla Zaffino A, PA-C 02/07/24 2105    Twilla Galea, MD 02/07/24 [phone]
